# Patient Record
Sex: Male | Born: 1980
Health system: Southern US, Community
[De-identification: ages and names within clinical notes are randomized; demographics above are authoritative.]

## PROBLEM LIST (undated history)

## (undated) HISTORY — PX: VASECTOMY: SHX75

---

## 2001-05-29 ENCOUNTER — Emergency Department (HOSPITAL_COMMUNITY): Admission: EM | Admit: 2001-05-29 | Discharge: 2001-05-30 | Payer: Self-pay | Admitting: Emergency Medicine

## 2016-01-11 DIAGNOSIS — Z1389 Encounter for screening for other disorder: Secondary | ICD-10-CM | POA: Diagnosis not present

## 2016-01-11 DIAGNOSIS — Z6827 Body mass index (BMI) 27.0-27.9, adult: Secondary | ICD-10-CM | POA: Diagnosis not present

## 2016-01-11 DIAGNOSIS — K589 Irritable bowel syndrome without diarrhea: Secondary | ICD-10-CM | POA: Diagnosis not present

## 2016-03-13 DIAGNOSIS — E782 Mixed hyperlipidemia: Secondary | ICD-10-CM | POA: Diagnosis not present

## 2016-03-13 DIAGNOSIS — Z0001 Encounter for general adult medical examination with abnormal findings: Secondary | ICD-10-CM | POA: Diagnosis not present

## 2016-03-13 DIAGNOSIS — Z6825 Body mass index (BMI) 25.0-25.9, adult: Secondary | ICD-10-CM | POA: Diagnosis not present

## 2016-03-13 DIAGNOSIS — Z1389 Encounter for screening for other disorder: Secondary | ICD-10-CM | POA: Diagnosis not present

## 2016-03-13 DIAGNOSIS — L309 Dermatitis, unspecified: Secondary | ICD-10-CM | POA: Diagnosis not present

## 2016-05-25 ENCOUNTER — Encounter: Payer: Self-pay | Admitting: Podiatry

## 2016-05-25 ENCOUNTER — Ambulatory Visit (INDEPENDENT_AMBULATORY_CARE_PROVIDER_SITE_OTHER): Payer: BLUE CROSS/BLUE SHIELD

## 2016-05-25 ENCOUNTER — Ambulatory Visit (INDEPENDENT_AMBULATORY_CARE_PROVIDER_SITE_OTHER): Payer: BLUE CROSS/BLUE SHIELD | Admitting: Podiatry

## 2016-05-25 VITALS — BP 107/82 | HR 95 | Resp 16 | Ht 66.0 in | Wt 160.0 lb

## 2016-05-25 DIAGNOSIS — M205X9 Other deformities of toe(s) (acquired), unspecified foot: Secondary | ICD-10-CM | POA: Diagnosis not present

## 2016-05-25 DIAGNOSIS — M779 Enthesopathy, unspecified: Secondary | ICD-10-CM | POA: Diagnosis not present

## 2016-05-25 DIAGNOSIS — M79672 Pain in left foot: Secondary | ICD-10-CM

## 2016-05-25 DIAGNOSIS — M79671 Pain in right foot: Secondary | ICD-10-CM | POA: Diagnosis not present

## 2016-05-25 MED ORDER — DICLOFENAC SODIUM 75 MG PO TBEC: 75.0000 mg | DELAYED_RELEASE_TABLET | Freq: Two times a day (BID) | ORAL | 2 refills | Status: AC

## 2016-05-25 MED ORDER — TRIAMCINOLONE ACETONIDE 10 MG/ML IJ SUSP
10.0000 mg | Freq: Once | INTRAMUSCULAR | Status: AC
  Administered 2016-05-25: 10 mg

## 2016-05-25 NOTE — Progress Notes (Signed)
   Subjective:    Patient ID: Rosanne AshingMichael W Copeman, male    DOB: 1981/03/12, 35 y.o.   MRN: [id number]  HPI Chief Complaint  Patient presents with  . Foot Pain    Bilateral; medial (closer to big toe); pt stated, "Feet hurt all day; wears steel-toed shoes to work"; x2 yrs      Review of Systems  Constitutional: Positive for diaphoresis and fatigue.  Gastrointestinal: Positive for abdominal distention.  Musculoskeletal: Positive for arthralgias, back pain, gait problem and myalgias.  Skin: Positive for rash.  All other systems reviewed and are negative.      Objective:   Physical Exam        Assessment & Plan:

## 2016-05-28 NOTE — Progress Notes (Signed)
Subjective:     Patient ID: Guy Mcdaniel, male   DOB: 04/18/1981, 35 y.o.   MRN: [id number]  HPI patient states that he gets pain around his big toe joints on both feet and they make it hard for him to wear shoe gear comfortably. States it's been going on for a while and steel toes make them worse   Review of Systems  All other systems reviewed and are negative.      Objective:   Physical Exam  Constitutional: He is oriented to person, place, and time.  Cardiovascular: Intact distal pulses.   Musculoskeletal: Normal range of motion.  Neurological: He is oriented to person, place, and time.  Skin: Skin is warm.  Nursing note and vitals reviewed.  neurovascular status intact muscle strength adequate range of motion within normal limits with patient found to have reduced range of motion first MPJ bilateral with the long gaited metatarsals and fluid buildup around the joint surfaces. Patient's found have good digital perfusion and is well oriented 3     Assessment:     Inflammatory changes consistent with hallux limitus deformity bilateral with the long gaited metatarsals and fluid buildup with pain    Plan:     H&P and conditions reviewed and at this time I injected around the first MPJ 3 mg Kenalog 5 mg Xylocaine and advised on long-term orthotics. Reappoint in the next 3 weeks to see response and decide what else may be appropriate  Excision indicate E long gaited first metatarsal bilateral that seems to be contributory towards the pain the patient experiences

## 2016-06-29 ENCOUNTER — Ambulatory Visit: Payer: BLUE CROSS/BLUE SHIELD | Admitting: Podiatry

## 2016-08-29 DIAGNOSIS — Z23 Encounter for immunization: Secondary | ICD-10-CM | POA: Diagnosis not present

## 2017-11-05 DIAGNOSIS — Z0001 Encounter for general adult medical examination with abnormal findings: Secondary | ICD-10-CM | POA: Diagnosis not present

## 2017-11-05 DIAGNOSIS — L719 Rosacea, unspecified: Secondary | ICD-10-CM | POA: Diagnosis not present

## 2017-11-05 DIAGNOSIS — L74512 Primary focal hyperhidrosis, palms: Secondary | ICD-10-CM | POA: Diagnosis not present

## 2017-11-05 DIAGNOSIS — B351 Tinea unguium: Secondary | ICD-10-CM | POA: Diagnosis not present

## 2017-11-05 DIAGNOSIS — Z0000 Encounter for general adult medical examination without abnormal findings: Secondary | ICD-10-CM | POA: Diagnosis not present

## 2017-11-05 DIAGNOSIS — Z6826 Body mass index (BMI) 26.0-26.9, adult: Secondary | ICD-10-CM | POA: Diagnosis not present

## 2017-11-05 DIAGNOSIS — Z1389 Encounter for screening for other disorder: Secondary | ICD-10-CM | POA: Diagnosis not present

## 2018-09-01 DIAGNOSIS — Z23 Encounter for immunization: Secondary | ICD-10-CM | POA: Diagnosis not present

## 2019-05-27 DIAGNOSIS — R001 Bradycardia, unspecified: Secondary | ICD-10-CM | POA: Diagnosis not present

## 2019-05-27 DIAGNOSIS — E7849 Other hyperlipidemia: Secondary | ICD-10-CM | POA: Diagnosis not present

## 2019-05-27 DIAGNOSIS — L719 Rosacea, unspecified: Secondary | ICD-10-CM | POA: Diagnosis not present

## 2019-05-27 DIAGNOSIS — Z1389 Encounter for screening for other disorder: Secondary | ICD-10-CM | POA: Diagnosis not present

## 2019-05-27 DIAGNOSIS — Z0000 Encounter for general adult medical examination without abnormal findings: Secondary | ICD-10-CM | POA: Diagnosis not present

## 2019-05-27 DIAGNOSIS — E663 Overweight: Secondary | ICD-10-CM | POA: Diagnosis not present

## 2019-05-27 DIAGNOSIS — Z6826 Body mass index (BMI) 26.0-26.9, adult: Secondary | ICD-10-CM | POA: Diagnosis not present

## 2019-06-22 ENCOUNTER — Other Ambulatory Visit: Payer: Self-pay

## 2019-06-22 DIAGNOSIS — Z20822 Contact with and (suspected) exposure to covid-19: Secondary | ICD-10-CM

## 2019-06-23 LAB — NOVEL CORONAVIRUS, NAA: SARS-CoV-2, NAA: NOT DETECTED

## 2019-09-15 ENCOUNTER — Other Ambulatory Visit: Payer: Self-pay

## 2019-09-15 ENCOUNTER — Ambulatory Visit (HOSPITAL_COMMUNITY)
Admission: RE | Admit: 2019-09-15 | Discharge: 2019-09-15 | Disposition: A | Discharge status: Home / Self Care | Payer: BC Managed Care – PPO | Source: Ambulatory Visit | Attending: Physician Assistant | Admitting: Physician Assistant

## 2019-09-15 ENCOUNTER — Other Ambulatory Visit (HOSPITAL_COMMUNITY): Payer: Self-pay | Admitting: Physician Assistant

## 2019-09-15 DIAGNOSIS — M79644 Pain in right finger(s): Secondary | ICD-10-CM | POA: Insufficient documentation

## 2019-09-15 DIAGNOSIS — Z6825 Body mass index (BMI) 25.0-25.9, adult: Secondary | ICD-10-CM | POA: Diagnosis not present

## 2019-09-15 DIAGNOSIS — S61212A Laceration without foreign body of right middle finger without damage to nail, initial encounter: Secondary | ICD-10-CM | POA: Diagnosis not present

## 2019-09-15 DIAGNOSIS — E663 Overweight: Secondary | ICD-10-CM | POA: Diagnosis not present

## 2019-10-05 ENCOUNTER — Ambulatory Visit: Payer: BC Managed Care – PPO | Attending: Internal Medicine

## 2019-10-05 ENCOUNTER — Other Ambulatory Visit: Payer: Self-pay

## 2019-10-05 DIAGNOSIS — Z20828 Contact with and (suspected) exposure to other viral communicable diseases: Secondary | ICD-10-CM | POA: Diagnosis not present

## 2019-10-05 DIAGNOSIS — Z20822 Contact with and (suspected) exposure to covid-19: Secondary | ICD-10-CM

## 2019-10-07 LAB — NOVEL CORONAVIRUS, NAA: SARS-CoV-2, NAA: DETECTED — AB

## 2019-10-12 DIAGNOSIS — Z8619 Personal history of other infectious and parasitic diseases: Secondary | ICD-10-CM | POA: Diagnosis not present

## 2019-10-14 DIAGNOSIS — Z20822 Contact with and (suspected) exposure to covid-19: Secondary | ICD-10-CM | POA: Diagnosis not present

## 2019-10-14 DIAGNOSIS — Z03818 Encounter for observation for suspected exposure to other biological agents ruled out: Secondary | ICD-10-CM | POA: Diagnosis not present

## 2019-10-14 DIAGNOSIS — R05 Cough: Secondary | ICD-10-CM | POA: Diagnosis not present

## 2019-10-19 ENCOUNTER — Ambulatory Visit: Payer: BC Managed Care – PPO | Attending: Internal Medicine

## 2019-10-19 ENCOUNTER — Other Ambulatory Visit: Payer: Self-pay

## 2019-10-19 DIAGNOSIS — Z20822 Contact with and (suspected) exposure to covid-19: Secondary | ICD-10-CM | POA: Diagnosis not present

## 2019-10-20 LAB — NOVEL CORONAVIRUS, NAA: SARS-CoV-2, NAA: NOT DETECTED

## 2019-11-16 DIAGNOSIS — R51 Headache with orthostatic component, not elsewhere classified: Secondary | ICD-10-CM | POA: Diagnosis not present

## 2019-11-16 DIAGNOSIS — E663 Overweight: Secondary | ICD-10-CM | POA: Diagnosis not present

## 2019-11-16 DIAGNOSIS — Z1389 Encounter for screening for other disorder: Secondary | ICD-10-CM | POA: Diagnosis not present

## 2019-11-16 DIAGNOSIS — Z6826 Body mass index (BMI) 26.0-26.9, adult: Secondary | ICD-10-CM | POA: Diagnosis not present

## 2019-12-01 DIAGNOSIS — R519 Headache, unspecified: Secondary | ICD-10-CM | POA: Diagnosis not present

## 2020-07-17 ENCOUNTER — Encounter (HOSPITAL_COMMUNITY): Payer: Self-pay | Admitting: Emergency Medicine

## 2020-07-17 ENCOUNTER — Emergency Department (HOSPITAL_COMMUNITY): Payer: BC Managed Care – PPO

## 2020-07-17 ENCOUNTER — Other Ambulatory Visit: Payer: Self-pay

## 2020-07-17 DIAGNOSIS — Z5321 Procedure and treatment not carried out due to patient leaving prior to being seen by health care provider: Secondary | ICD-10-CM | POA: Diagnosis not present

## 2020-07-17 DIAGNOSIS — M25531 Pain in right wrist: Secondary | ICD-10-CM | POA: Diagnosis not present

## 2020-07-17 NOTE — ED Triage Notes (Signed)
Pt c/o right wrist pain that started tonight.

## 2020-07-18 ENCOUNTER — Emergency Department (HOSPITAL_COMMUNITY)
Admission: EM | Admit: 2020-07-18 | Discharge: 2020-07-18 | Disposition: A | Discharge status: Home / Self Care | Payer: BC Managed Care – PPO | Attending: Emergency Medicine | Admitting: Emergency Medicine

## 2020-07-18 DIAGNOSIS — M25531 Pain in right wrist: Secondary | ICD-10-CM | POA: Diagnosis not present

## 2020-07-18 DIAGNOSIS — M25831 Other specified joint disorders, right wrist: Secondary | ICD-10-CM | POA: Diagnosis not present

## 2020-12-07 IMAGING — DX DG FINGER MIDDLE 2+V*R*
3 series · 3 of 3 positions shown · non-contrast
Comparison: None.

CLINICAL DATA: Recent PIP laceration with pain and swelling,
initial encounter

EXAM:
RIGHT MIDDLE FINGER 2+V

[finger ap]
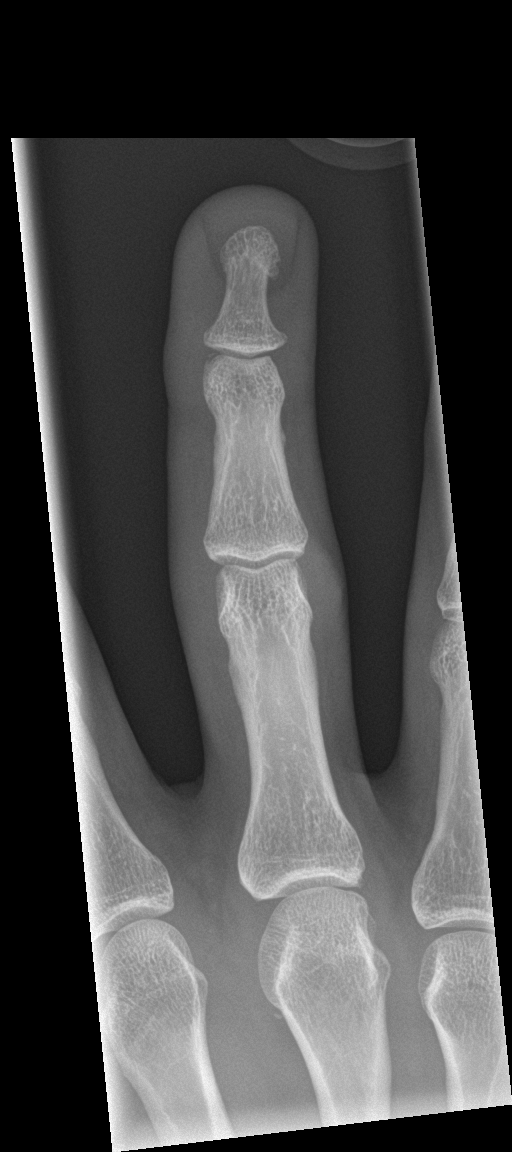

[finger obl]
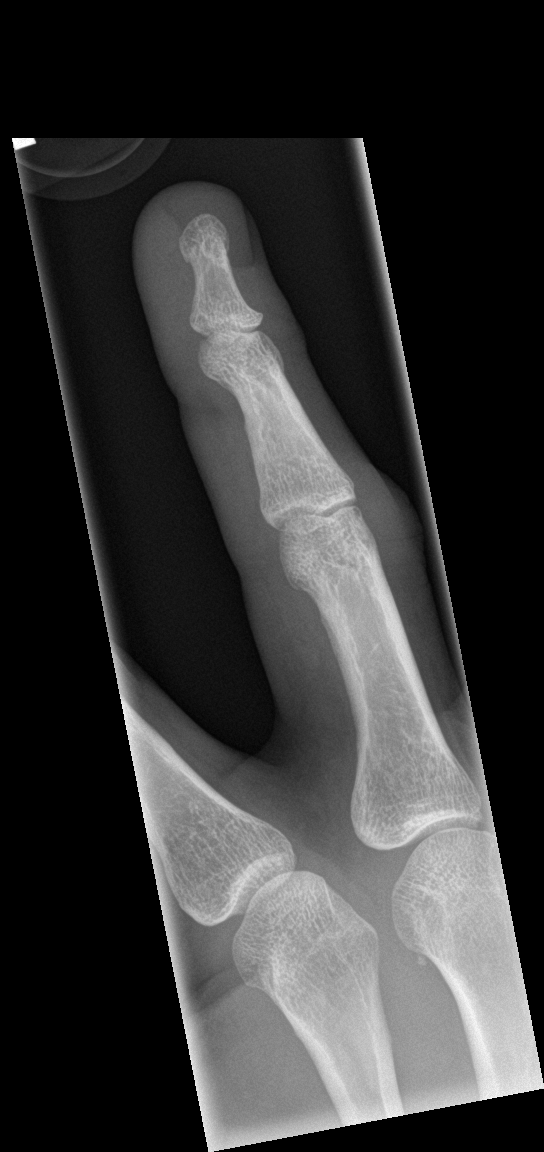

[finger lat]
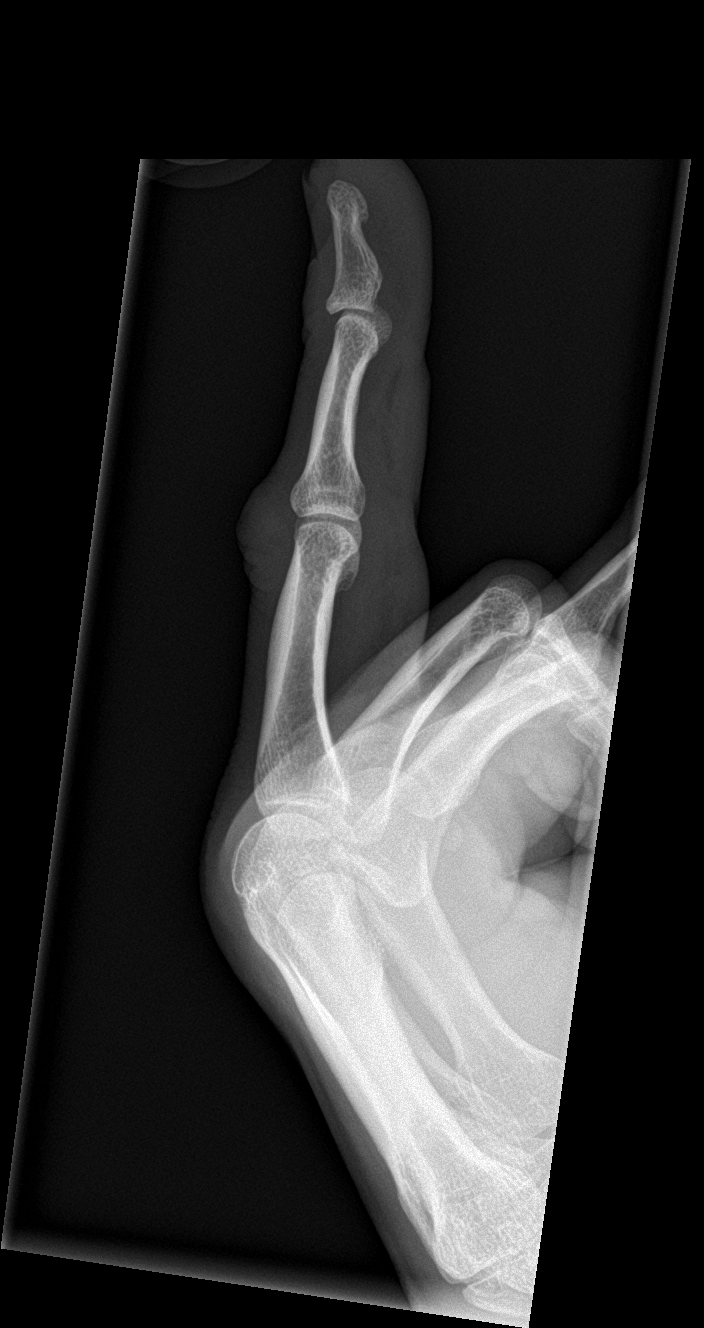

[3 of 3 positions shown; findings below may reference images not displayed]

FINDINGS: Soft tissue swelling is noted about the PIP joint consistent with
given clinical history. Some lucency is noted along the palm are
aspect of the midportion of the finger which could represent some
underlying gas. No radiopaque foreign body is noted. No fracture is
seen.
IMPRESSION: Soft tissue changes consistent with the recent injury. No acute bony
abnormality is noted.

## 2021-08-29 DIAGNOSIS — Z20822 Contact with and (suspected) exposure to covid-19: Secondary | ICD-10-CM | POA: Diagnosis not present

## 2021-08-29 DIAGNOSIS — M791 Myalgia, unspecified site: Secondary | ICD-10-CM | POA: Diagnosis not present

## 2021-08-29 DIAGNOSIS — R070 Pain in throat: Secondary | ICD-10-CM | POA: Diagnosis not present

## 2021-08-29 DIAGNOSIS — J069 Acute upper respiratory infection, unspecified: Secondary | ICD-10-CM | POA: Diagnosis not present

## 2021-10-09 IMAGING — DX DG WRIST COMPLETE 3+V*R*
4 series · 4 of 4 positions shown · non-contrast
Comparison: Right third digit radiograph 09/15/2019

CLINICAL DATA: Wrist pain

EXAM:
RIGHT WRIST - COMPLETE 3+ VIEW

[wrist pa]
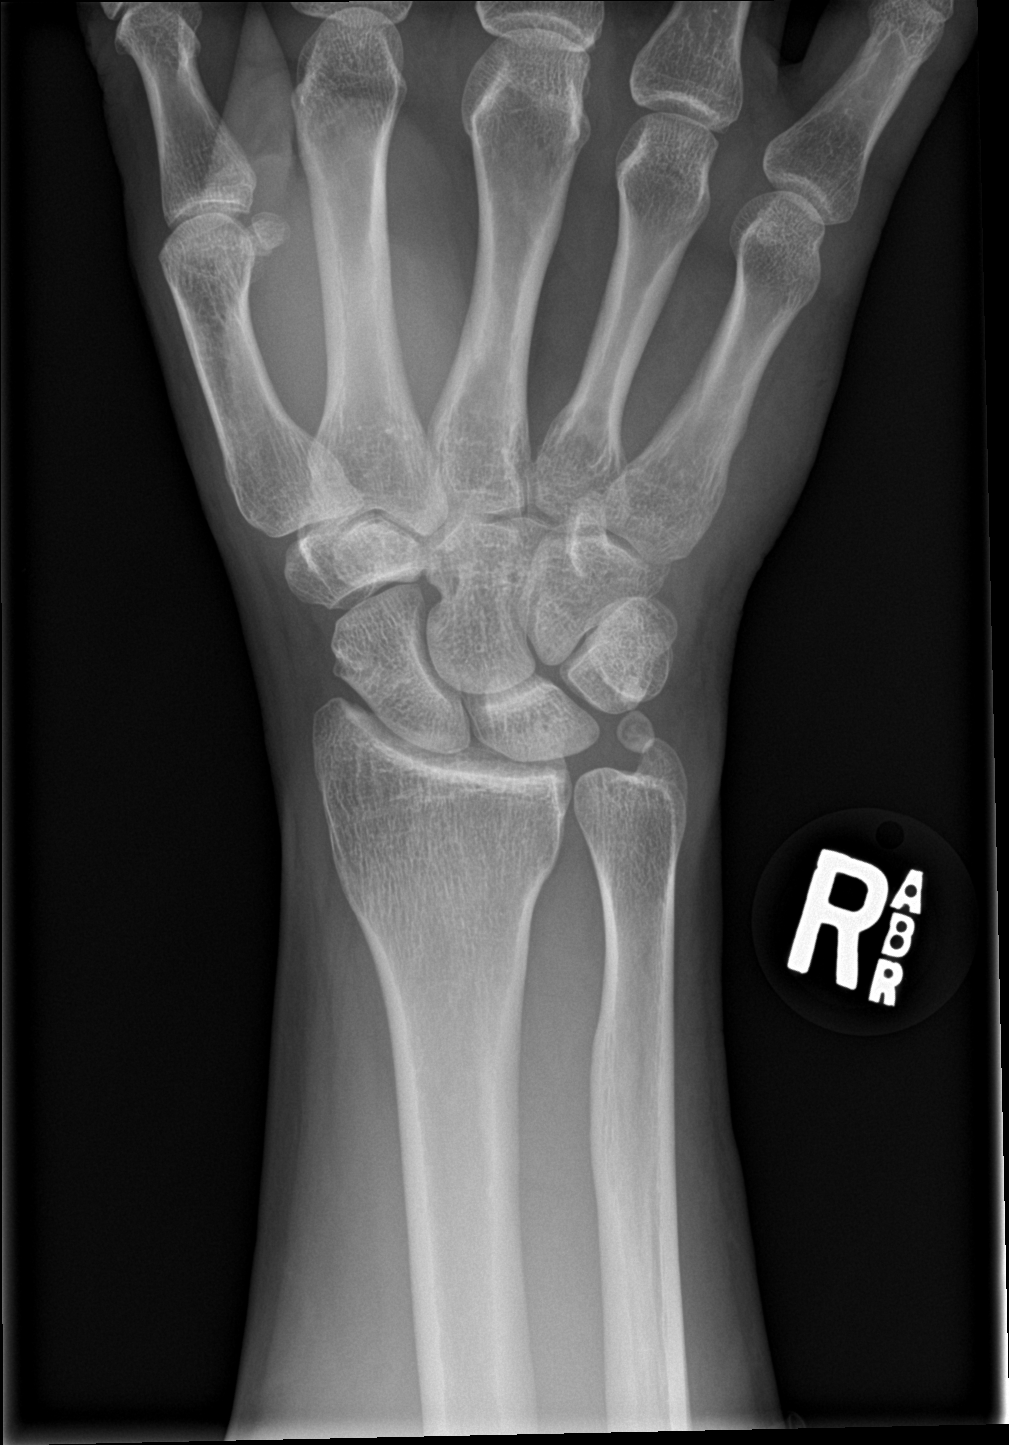

[wrist obl]
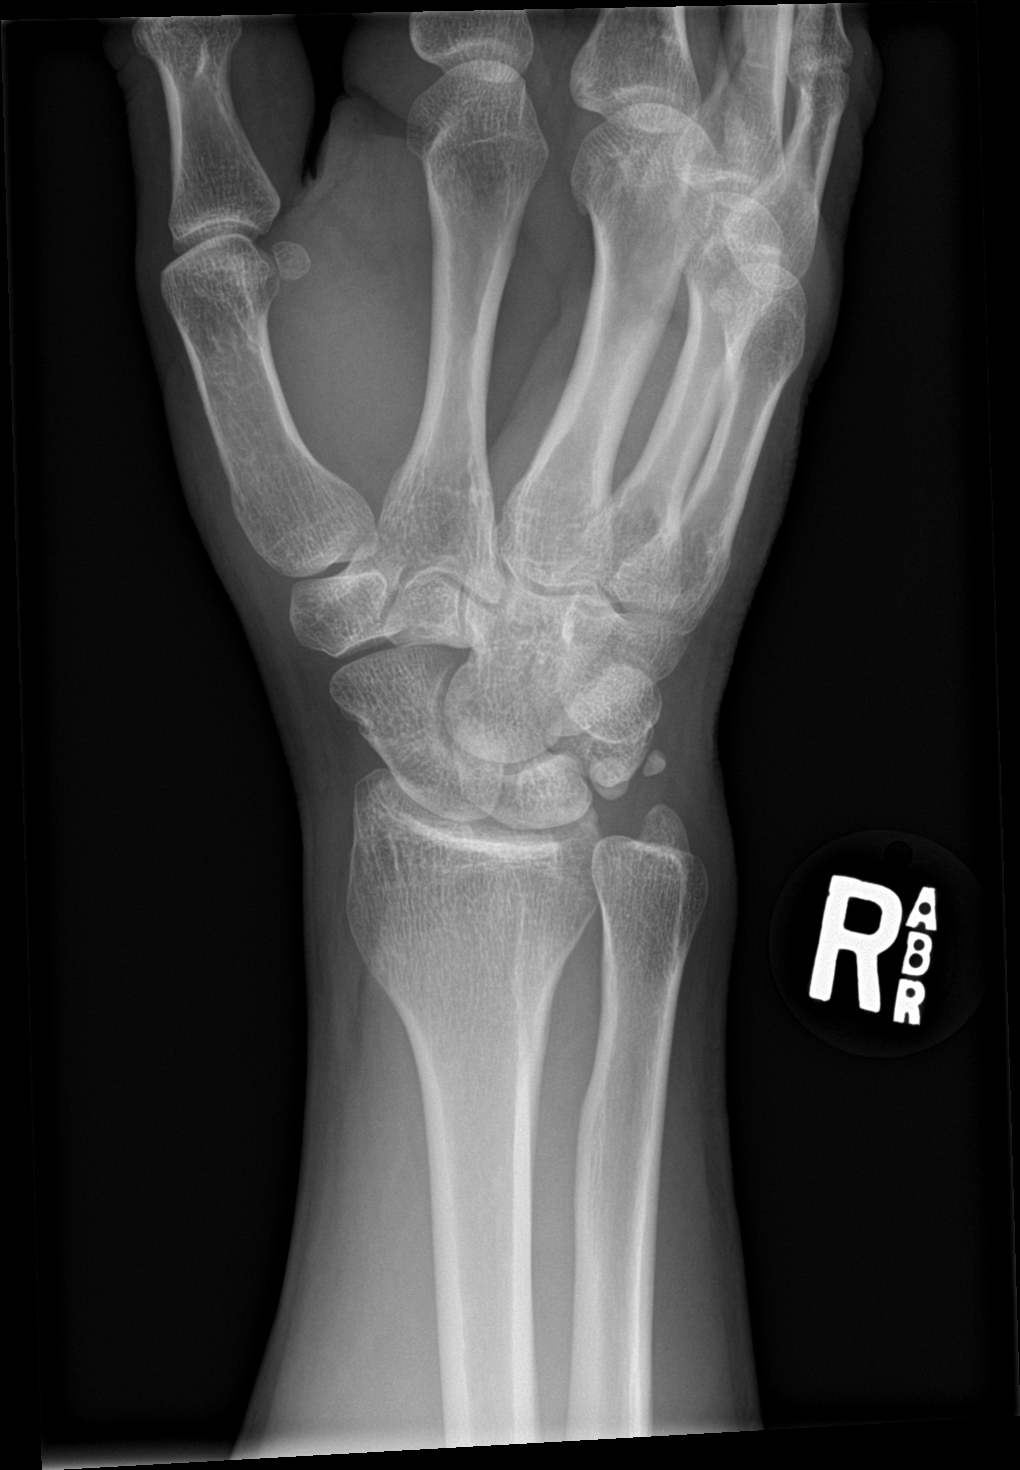

[wrist lat]
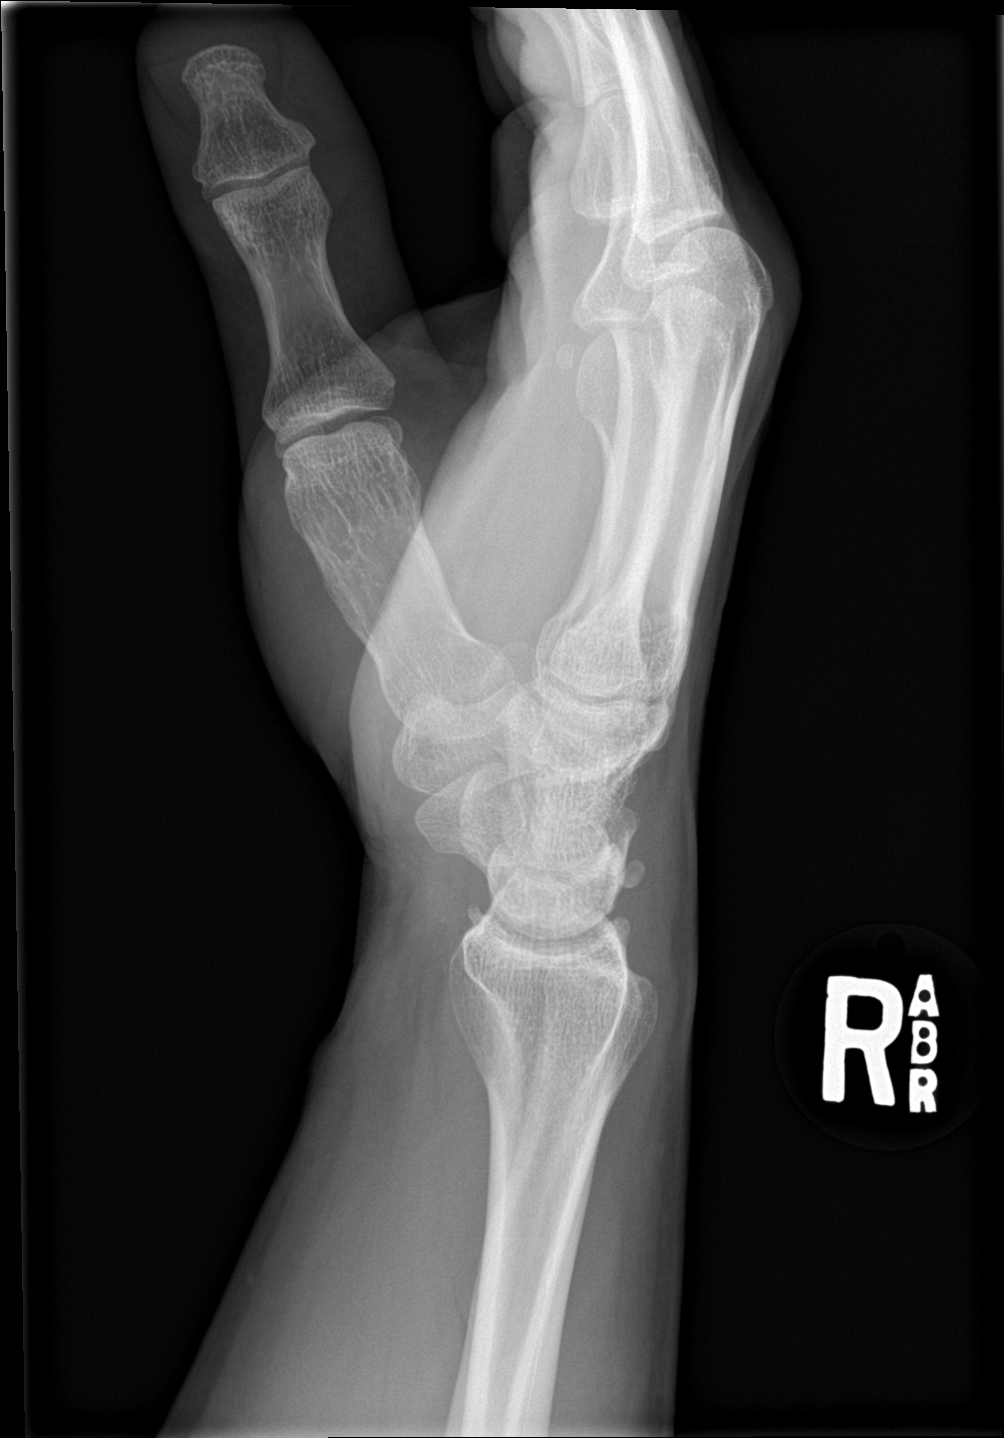

[wrist navicular]
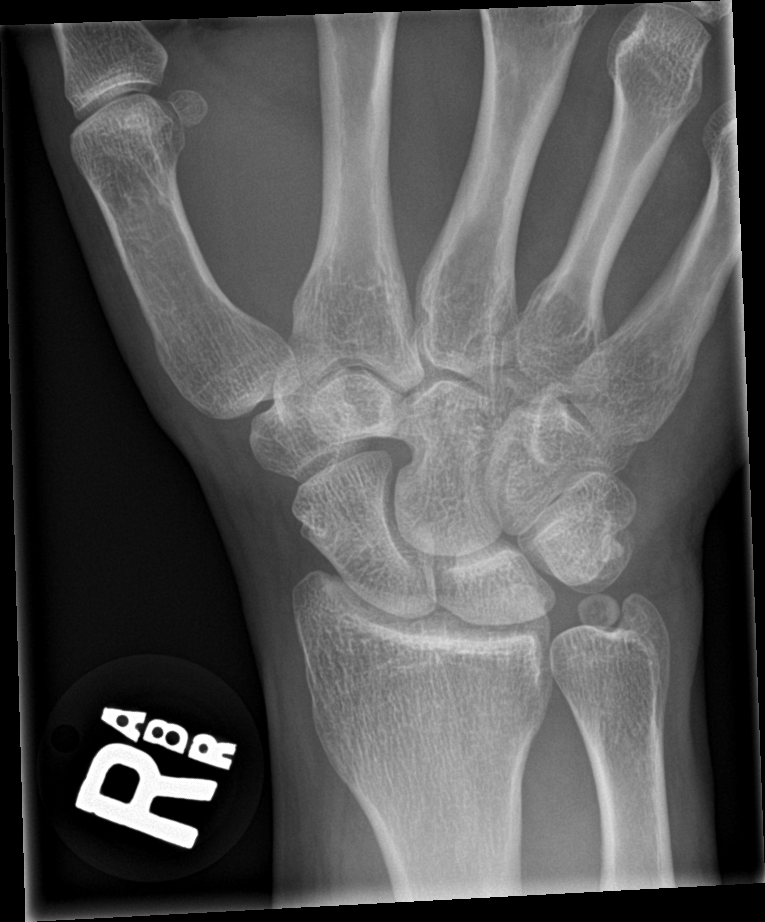

[4 of 4 positions shown; findings below may reference images not displayed]

FINDINGS: No acute bony abnormality. Specifically, no fracture, subluxation,
or dislocation. Corticated ossicle seen within the triangular
fibrocartilage, compatible with an os triangulare. Additional
ossicle seen along the dorsal aspect of the wrist, likely os
epilunarum. Bony coalition is seen between the distal capitate and
triquetrum. Soft tissues are unremarkable.
IMPRESSION: 1. No acute osseous abnormality.
2. Several small accessory ossicles
3. Bony coalition between the distal capitate and triquetrum.

## 2022-04-24 ENCOUNTER — Ambulatory Visit
Admission: RE | Admit: 2022-04-24 | Discharge: 2022-04-24 | Disposition: A | Discharge status: Home / Self Care | Payer: BC Managed Care – PPO | Source: Ambulatory Visit | Attending: Nurse Practitioner | Admitting: Nurse Practitioner

## 2022-04-24 ENCOUNTER — Ambulatory Visit (INDEPENDENT_AMBULATORY_CARE_PROVIDER_SITE_OTHER): Payer: BLUE CROSS/BLUE SHIELD

## 2022-04-24 VITALS — BP 143/87 | HR 91 | Temp 98.5°F | Resp 18

## 2022-04-24 DIAGNOSIS — M79641 Pain in right hand: Secondary | ICD-10-CM

## 2022-04-24 DIAGNOSIS — M7989 Other specified soft tissue disorders: Secondary | ICD-10-CM | POA: Diagnosis not present

## 2022-04-24 MED ORDER — PREDNISONE 20 MG PO TABS: 40.0000 mg | ORAL_TABLET | Freq: Every day | ORAL | 0 refills | Status: AC

## 2022-04-24 MED ORDER — ACETAMINOPHEN ER 650 MG PO TBCR: 650.0000 mg | EXTENDED_RELEASE_TABLET | Freq: Three times a day (TID) | ORAL | 0 refills | Status: AC | PRN

## 2022-04-24 NOTE — ED Triage Notes (Signed)
No known injury

## 2022-04-24 NOTE — Discharge Instructions (Addendum)
Your x-rays did not show any fracture or dislocations of the right hand or right middle finger. As discussed, we are checking a uric acid level to check you for gout.  If the results are abnormal, you will be contacted. Take medication as prescribed.  When she finished the prednisone, you can take Tylenol arthritis strength Continue the use of ice to the right middle finger.  Apply for 20 minutes, remove for 1 hour, then repeat. If you are able to tolerate it, you may start using the compression sleeve for your hand to help with swelling. As discussed, if your symptoms do not improve, recommend following up with a hand specialist for further evaluation. If your symptoms do not improve, recommend following up with a hand specialist at the [facility] [place] at [phone] or [facility] in [place] at [phone].

## 2022-04-24 NOTE — ED Triage Notes (Signed)
Pain and swelling around knuckle on right hand x 2 week off and on.

## 2022-04-24 NOTE — ED Provider Notes (Addendum)
[facility]    CSN: [id number] Arrival date & time: 04/24/22  1453      History   Chief Complaint Chief Complaint  Patient presents with   Hand Problem    Entered by patient    HPI Guy Mcdaniel is a 41 y.o. male.   The history is provided by the patient.   Patient presents for complaints of right hand pain that has been present for the past 2 to 3 weeks.  Patient states pain starts around the knuckle below his middle finger.  He states the are has been swelling on and off for the past 2 weeks.  He denies any known injury or trauma.  Pain worsens when he moves the middle finger from side to side.  He also states that his grip strength is weakened.  He does not recall any known injury or trauma.  He denies bruising, numbness, tingling, or radiation of pain.  Patient states he has been taking ibuprofen, Aleve, and using IcyHot for his symptoms.  History reviewed. No pertinent past medical history.  There are no problems to display for this patient.   Past Surgical History:  Procedure Laterality Date   VASECTOMY         Home Medications    Prior to Admission medications   Medication Sig Start Date End Date Taking? Authorizing Provider  acetaminophen (TYLENOL 8 HOUR) 650 MG CR tablet Take 1 tablet (650 mg total) by mouth every 8 (eight) hours as needed for pain. 04/24/22  Yes Toshiko Kemler-Warren, Alda Lea, NP  predniSONE (DELTASONE) 20 MG tablet Take 2 tablets (40 mg total) by mouth daily with breakfast for 5 days. 04/24/22 04/29/22 Yes Jeryn Bertoni-Warren, Alda Lea, NP  diclofenac (VOLTAREN) 75 MG EC tablet Take 1 tablet (75 mg total) by mouth 2 (two) times daily. 05/25/16   Wallene Huh, DPM    Family History History reviewed. No pertinent family history.  Social History Social History   Tobacco Use   Smoking status: Former   Smokeless tobacco: Never  Substance Use Topics   Alcohol use: Yes    Comment: occ   Drug use: Never     Allergies   Patient has  no known allergies.   Review of Systems Review of Systems Per HPI  Physical Exam Triage Vital Signs ED Triage Vitals  Enc Vitals Group     BP 04/24/22 1505 (!) 143/87     Pulse Rate 04/24/22 1505 91     Resp 04/24/22 1505 18     Temp 04/24/22 1505 98.5 F (36.9 C)     Temp Source 04/24/22 1505 Oral     SpO2 04/24/22 1505 95 %     Weight --      Height --      Head Circumference --      Peak Flow --      Pain Score 04/24/22 1506 6     Pain Loc --      Pain Edu? --      Excl. in [place]? --    No data found.  Updated Vital Signs BP (!) 143/87 (BP Location: Right Arm)   Pulse 91   Temp 98.5 F (36.9 C) (Oral)   Resp 18   SpO2 95%   Visual Acuity Right Eye Distance:   Left Eye Distance:   Bilateral Distance:    Right Eye Near:   Left Eye Near:    Bilateral Near:     Physical Exam Vitals and  nursing note reviewed.  Eyes:     Extraocular Movements: Extraocular movements intact.     Conjunctiva/sclera: Conjunctivae normal.     Pupils: Pupils are equal, round, and reactive to light.  Pulmonary:     Effort: Pulmonary effort is normal.  Musculoskeletal:     Right hand: Swelling and tenderness present. Decreased strength. Normal sensation. Normal capillary refill. Normal pulse.     Left hand: No swelling or tenderness. Normal strength.     Cervical back: Normal range of motion.     Comments: Swelling and tenderness noted to the head/neck region of the third metacarpal joint.  Skin:    General: Skin is warm and dry.  Neurological:     General: No focal deficit present.     Mental Status: He is oriented to person, place, and time.  Psychiatric:        Mood and Affect: Mood normal.        Behavior: Behavior normal.      UC Treatments / Results  Labs (all labs ordered are listed, but only abnormal results are displayed) Labs Reviewed - No data to display   EKG   Radiology DG Hand Complete Right  Result Date: 04/24/2022 CLINICAL DATA:  Right hand swelling  without known injury. EXAM: RIGHT HAND - COMPLETE 3+ VIEW COMPARISON:  None Available. FINDINGS: There is no evidence of fracture or dislocation. There is no evidence of arthropathy or other focal bone abnormality. Soft tissues are unremarkable. IMPRESSION: Negative. Electronically Signed   By: Lupita Raider M.D.   On: 04/24/2022 15:28    Procedures Procedures (including critical care time)  Medications Ordered in UC Medications - No data to display  Initial Impression / Assessment and Plan / UC Course  I have reviewed the triage vital signs and the nursing notes.  Pertinent labs & imaging results that were available during my care of the patient were reviewed by me and considered in my medical decision making (see chart for details).  Patient presents for complaints of right hand pain.  Pain is specific to the third phalanges of the right hand extending into the metacarpal joint.  There is also swelling and tenderness noted on exam.  Patient has no inciting injury or trauma to this location.  X-rays were negative.  Attempted to collect uric acid level to rule out gout, but difficulty due to patient mangling and malfunctioning of blood tubes.  Patient was advised that if symptoms improve but then return, we can have him follow-up and attempt to collect the blood work at a later date.  Differential diagnoses include gout, hand sprain, or flexor tenosynovitis.  Supportive care recommendations were provided to the patient.  Discussed with patient that if symptoms do not improve, will recommend that he follow-up with a hand specialist.  Patient was given the information for [facility] [place] and for [facility] in [place].  Follow-up as needed. Final Clinical Impressions(s) / UC Diagnoses   Final diagnoses:  Right hand pain     Discharge Instructions      Your x-rays did not show any fracture or dislocations of the right hand or right middle finger. As discussed, we are checking a  uric acid level to check you for gout.  If the results are abnormal, you will be contacted. Take medication as prescribed.  When she finished the prednisone, you can take Tylenol arthritis strength Continue the use of ice to the right middle finger.  Apply for 20 minutes, remove  for 1 hour, then repeat. If you are able to tolerate it, you may start using the compression sleeve for your hand to help with swelling. As discussed, if your symptoms do not improve, recommend following up with a hand specialist for further evaluation. If your symptoms do not improve, recommend following up with a hand specialist at the [facility] [place] at [phone] or [facility] in [place] at [phone].     ED Prescriptions     Medication Sig Dispense Auth. Provider   predniSONE (DELTASONE) 20 MG tablet Take 2 tablets (40 mg total) by mouth daily with breakfast for 5 days. 10 tablet Malik Paar-Warren, Sadie Haber, NP   acetaminophen (TYLENOL 8 HOUR) 650 MG CR tablet Take 1 tablet (650 mg total) by mouth every 8 (eight) hours as needed for pain. 30 tablet Dannetta Lekas-Warren, Sadie Haber, NP      PDMP not reviewed this encounter.   Abran Cantor, NP 04/24/22 1555    Jackalynn Art-Warren, Sadie Haber, NP 04/24/22 1616

## 2022-04-24 NOTE — ED Notes (Signed)
Patient stuck 3 times in attempt to obtain blood for uric acid.  Unable to obtain blood Lorene Dy NP notified.

## 2022-05-23 DIAGNOSIS — Z0000 Encounter for general adult medical examination without abnormal findings: Secondary | ICD-10-CM | POA: Diagnosis not present

## 2022-05-23 DIAGNOSIS — Z1331 Encounter for screening for depression: Secondary | ICD-10-CM | POA: Diagnosis not present

## 2022-05-23 DIAGNOSIS — M109 Gout, unspecified: Secondary | ICD-10-CM | POA: Diagnosis not present

## 2022-05-23 DIAGNOSIS — Z6827 Body mass index (BMI) 27.0-27.9, adult: Secondary | ICD-10-CM | POA: Diagnosis not present

## 2022-05-23 DIAGNOSIS — B354 Tinea corporis: Secondary | ICD-10-CM | POA: Diagnosis not present

## 2022-05-23 DIAGNOSIS — M5431 Sciatica, right side: Secondary | ICD-10-CM | POA: Diagnosis not present

## 2022-05-23 DIAGNOSIS — R7309 Other abnormal glucose: Secondary | ICD-10-CM | POA: Diagnosis not present

## 2022-05-23 DIAGNOSIS — E785 Hyperlipidemia, unspecified: Secondary | ICD-10-CM | POA: Diagnosis not present
